# Patient Record
Sex: Female | Born: 1995 | Race: White | Marital: Married | Smoking: Never smoker
Health system: Southern US, Community
[De-identification: ages and names within clinical notes are randomized; demographics above are authoritative.]

---

## 2016-10-13 ENCOUNTER — Encounter: Payer: Self-pay | Admitting: Obstetrics & Gynecology

## 2016-10-23 ENCOUNTER — Ambulatory Visit (INDEPENDENT_AMBULATORY_CARE_PROVIDER_SITE_OTHER): Payer: BLUE CROSS/BLUE SHIELD | Admitting: Family Medicine

## 2016-10-23 ENCOUNTER — Encounter: Payer: Self-pay | Admitting: Family Medicine

## 2016-10-23 DIAGNOSIS — O09899 Supervision of other high risk pregnancies, unspecified trimester: Secondary | ICD-10-CM | POA: Insufficient documentation

## 2016-10-23 DIAGNOSIS — O0932 Supervision of pregnancy with insufficient antenatal care, second trimester: Secondary | ICD-10-CM

## 2016-10-23 DIAGNOSIS — Z3490 Encounter for supervision of normal pregnancy, unspecified, unspecified trimester: Secondary | ICD-10-CM

## 2016-10-23 DIAGNOSIS — Z124 Encounter for screening for malignant neoplasm of cervix: Secondary | ICD-10-CM

## 2016-10-23 DIAGNOSIS — Z113 Encounter for screening for infections with a predominantly sexual mode of transmission: Secondary | ICD-10-CM

## 2016-10-23 DIAGNOSIS — O09299 Supervision of pregnancy with other poor reproductive or obstetric history, unspecified trimester: Secondary | ICD-10-CM | POA: Insufficient documentation

## 2016-10-23 DIAGNOSIS — O09292 Supervision of pregnancy with other poor reproductive or obstetric history, second trimester: Secondary | ICD-10-CM | POA: Diagnosis not present

## 2016-10-23 DIAGNOSIS — O0930 Supervision of pregnancy with insufficient antenatal care, unspecified trimester: Secondary | ICD-10-CM | POA: Insufficient documentation

## 2016-10-23 MED ORDER — ASPIRIN EC 81 MG PO TBEC: 81.0000 mg | DELAYED_RELEASE_TABLET | Freq: Every day | ORAL | 2 refills | Status: AC

## 2016-10-23 NOTE — Progress Notes (Signed)
Pt here for initial prenatal visit. She has not had a pap and will need one today. Bedside US shows single IUP measuring 8232w4d avg between HC, FL, CRL. FHR = 145  CLINICAL DATA:  Pregnant patient in 2nd trimester pregnancy with (COMPLAINTS)  EXAM: AB OB ULTRASOUND   TECHNIQUE:  AB ultrasound was performed for complete evaluation of the gestation    FINDINGS: Intrauterine gestational sac: SIUP  Cardiac Activity: 145 bpm  US EDC: 5932w4d    By: Janit BernMandy Prestin Munch, CMA

## 2016-10-23 NOTE — Progress Notes (Signed)
  Subjective:    Tiffany Krueger is a G2P1001 745w6d being seen today for her first obstetrical visit.  Her obstetrical history is significant for short interval between pregnancies, history of preeclampsia in previous pregnancy. Patient does not intend to breast feed. Pregnancy history fully reviewed.  Patient reports no complaints.  Vitals:   10/23/16 0919 10/23/16 0919  BP: 121/71   Pulse: (!) 106   Weight: 181 lb (82.1 kg)   Height:  5' (1.524 m)    HISTORY: OB History  Gravida Para Term Preterm AB Living  2 1 1     1   SAB TAB Ectopic Multiple Live Births          1    # Outcome Date GA Lbr Len/2nd Weight Sex Delivery Anes PTL Lv  2 Current           1 Term 04/11/16 1173w0d    Vag-Spont   LIV     History reviewed. No pertinent past medical history. History reviewed. No pertinent surgical history. History reviewed. No pertinent family history.   Exam    Uterus:     Pelvic Exam:    Perineum: No Hemorrhoids, Normal Perineum   Vulva: normal, Bartholin's, Urethra, Skene's normal   Vagina:  normal mucosa   Cervix: multiparous appearance and no bleeding following Pap   Adnexa: normal adnexa   Bony Pelvis: gynecoid  System: Breast:  normal appearance, no masses or tenderness, No nipple discharge or bleeding, No axillary or supraclavicular adenopathy, Normal to palpation without dominant masses   Skin: normal coloration and turgor, no rashes    Neurologic: oriented, normal, gait normal; reflexes normal and symmetric   Extremities: normal strength, tone, and muscle mass   HEENT PERRLA and extra ocular movement intact   Mouth/Teeth mucous membranes moist, pharynx normal without lesions   Neck supple and no masses   Cardiovascular: regular rate and rhythm, no murmurs or gallops   Respiratory:  appears well, vitals normal, no respiratory distress, acyanotic, normal RR, ear and throat exam is normal, neck free of mass or lymphadenopathy, chest clear, no wheezing, crepitations,  rhonchi, normal symmetric air entry   Abdomen: soft, non-tender; bowel sounds normal; no masses,  no organomegaly   Urinary: urethral meatus normal      Assessment:    Pregnancy: G2P1001 Patient Active Problem List   Diagnosis Date Noted  . Supervision of normal pregnancy, antepartum 10/23/2016  . Late prenatal care 10/23/2016  . Short interval between pregnancies affecting pregnancy, antepartum 10/23/2016        Plan:     Initial labs drawn. Prenatal vitamins. Baby ASA  Problem list reviewed and updated. Genetic Screening discussed Quad Screen: declined.  Ultrasound discussed; fetal survey: ordered.  Follow up in 4 weeks. 50% of 30 min visit spent on counseling and coordination of care.       Levie HeritageJacob J Bennie Scaff 10/23/2016

## 2016-10-24 LAB — GC/CHLAMYDIA PROBE AMP (~~LOC~~) NOT AT ARMC
Chlamydia: NEGATIVE
Neisseria Gonorrhea: NEGATIVE

## 2016-10-24 LAB — OBSTETRIC PANEL, INCLUDING HIV
Antibody Screen: NEGATIVE
BASOS ABS: 0 10*3/uL (ref 0.0–0.2)
Basos: 0 %
EOS (ABSOLUTE): 0.1 10*3/uL (ref 0.0–0.4)
Eos: 1 %
HEP B S AG: NEGATIVE
HIV SCREEN 4TH GENERATION: NONREACTIVE
Hematocrit: 39.9 % (ref 34.0–46.6)
Hemoglobin: 13.6 g/dL (ref 11.1–15.9)
IMMATURE GRANULOCYTES: 0 %
Immature Grans (Abs): 0 10*3/uL (ref 0.0–0.1)
LYMPHS ABS: 2 10*3/uL (ref 0.7–3.1)
Lymphs: 18 %
MCH: 30.4 pg (ref 26.6–33.0)
MCHC: 34.1 g/dL (ref 31.5–35.7)
MCV: 89 fL (ref 79–97)
MONOCYTES: 4 %
Monocytes Absolute: 0.5 10*3/uL (ref 0.1–0.9)
NEUTROS ABS: 8.8 10*3/uL — AB (ref 1.4–7.0)
NEUTROS PCT: 77 %
PLATELETS: 252 10*3/uL (ref 150–379)
RBC: 4.47 x10E6/uL (ref 3.77–5.28)
RDW: 13.3 % (ref 12.3–15.4)
RPR: NONREACTIVE
Rh Factor: POSITIVE
Rubella Antibodies, IGG: 6.57 index (ref >0.99)
WBC: 11.5 10*3/uL — AB (ref 3.4–10.8)

## 2016-10-24 LAB — HEMOGLOBIN A1C
ESTIMATED AVERAGE GLUCOSE: 85 mg/dL
HEMOGLOBIN A1C: 4.6 % — AB (ref 4.8–5.6)

## 2016-10-24 LAB — CYTOLOGY - PAP: Diagnosis: NEGATIVE

## 2016-10-24 LAB — GLUCOSE, RANDOM: GLUCOSE: 88 mg/dL (ref 65–99)

## 2016-10-27 ENCOUNTER — Ambulatory Visit (HOSPITAL_COMMUNITY)
Admission: RE | Admit: 2016-10-27 | Discharge: 2016-10-27 | Disposition: A | Discharge status: Home / Self Care | Payer: BLUE CROSS/BLUE SHIELD | Source: Ambulatory Visit | Attending: Family Medicine | Admitting: Family Medicine

## 2016-10-27 DIAGNOSIS — O283 Abnormal ultrasonic finding on antenatal screening of mother: Secondary | ICD-10-CM

## 2016-10-27 DIAGNOSIS — O09299 Supervision of pregnancy with other poor reproductive or obstetric history, unspecified trimester: Secondary | ICD-10-CM | POA: Diagnosis not present

## 2016-10-27 DIAGNOSIS — Z3490 Encounter for supervision of normal pregnancy, unspecified, unspecified trimester: Secondary | ICD-10-CM

## 2016-10-27 DIAGNOSIS — O09892 Supervision of other high risk pregnancies, second trimester: Secondary | ICD-10-CM | POA: Insufficient documentation

## 2016-10-27 DIAGNOSIS — O09899 Supervision of other high risk pregnancies, unspecified trimester: Secondary | ICD-10-CM

## 2016-10-27 DIAGNOSIS — O0930 Supervision of pregnancy with insufficient antenatal care, unspecified trimester: Secondary | ICD-10-CM

## 2016-10-27 DIAGNOSIS — Z3689 Encounter for other specified antenatal screening: Secondary | ICD-10-CM | POA: Insufficient documentation

## 2016-10-27 DIAGNOSIS — Z3A18 18 weeks gestation of pregnancy: Secondary | ICD-10-CM | POA: Diagnosis not present

## 2016-10-27 LAB — CULTURE, URINE COMPREHENSIVE

## 2016-10-28 LAB — TOXASSURE SELECT 13 (MW), URINE

## 2016-10-30 ENCOUNTER — Telehealth (HOSPITAL_COMMUNITY): Payer: Self-pay | Admitting: MS"

## 2016-10-30 DIAGNOSIS — O283 Abnormal ultrasonic finding on antenatal screening of mother: Secondary | ICD-10-CM | POA: Insufficient documentation

## 2016-10-30 DIAGNOSIS — Z3A18 18 weeks gestation of pregnancy: Secondary | ICD-10-CM | POA: Insufficient documentation

## 2016-10-30 NOTE — Progress Notes (Signed)
Genetic Counseling  High-Risk Gestation Note  Appointment Date:  10/30/2016 Referred By: Truett Mainland, DO Date of Birth:  09/21/1996 Partner:  Tiffany Krueger   Pregnancy History: [id number] Estimated Date of Delivery: 03/30/17 Estimated Gestational Age: [redacted]w[redacted]d I met with Mrs. Tiffany Krueger her husband, [name] for genetic counseling because of abnormal ultrasound findings.   In summary:  Discussed ultrasound findings in detail  Atypical fetal cardiac anatomy today: abnormal situs, cardiac angle nearly 90 degrees, abnormal outflow tracts  Single umbilical artery  Discussed can be isolated or syndromic and that various etiologies including sporadic, multifactorial, chromosome condition, single gene conditions, teratogenic exposure  Reviewed options for additional screening  NIPS- elected to pursue Panorama today (expanded panel to include 22q11 deletion analysis)  Echocardiogram- being facilitated ASAP  Expanded carrier screening panel- declined  Ongoing ultrasound- follow-up scheduled 11/10/16  Reviewed options for diagnostic testing, including risks, benefits, limitations and alternatives- declined amniocentesis  Discussed option of continuation vs. Termination of pregnancy  Reviewed family history concerns  Patient's paternal half-niece (paternal half-brother's daughter) with unilateral renal agenesis  We began by reviewing the ultrasound in detail. Anatomy ultrasound performed today visualized the pregnancy to be 179w0dwith an EDC of 03/30/17. Atypical fetal heart anatomy was visualized today.  Abnormal situs, cardiac angle nearly 90 degrees, and abnormal outflow tracts with a suspected interrupted arch and abnormal RVOT. Single umbilical artery visualized today. Remaining visualized fetal anatomy within normal limits. Complete ultrasound results under separate cover.    This couple was counseled that congenital heart defects occur at a frequency of  approximately 1 in 100 births.  The specific heart defect present in the pregnancy was not able to be determined today, but we reviewed that there are concerns with the outflow tracts and position of the heart. We discussed that congenital heart defects can be genetic, environmental, or multifactorial in etiology.  This couple was counseled that congenital heart defects are prominent features in chromosome conditions, particularly autosomal trisomies and microdeletions.  Approximately 15-20% of congenital heart defects result from an underlying chromosome condition.    We reviewed chromosomes, nondisjunction, and the common features of Down syndrome, trisomies 13/18, and 22q11 deletion syndrome. We reviewed that other chromosome aberrations, including microdeletions, microduplications, and inversions, can also be associated with fetal heart defects.  In addition, single gene conditions are also a common genetic cause of congenital heart defects.  To date, more than 500 single gene disorders are known to have congenital heart disease as a described feature. We briefly reviewed specific single gene inheritance patterns and associated risks of recurrence.    Environmental causes for congenital heart defects are well described and many teratogens are known to be linked to congenital heart disease (alcohol and specific medications).  Additionally, we discussed that the incidence of congenital heart defects is increased among individuals who have insulin dependent diabetes. Mrs. AsAndrika Perazaeported no known teratogenic exposures during the pregnancy. The remainder of the cases (>70%) are classified as multifactorial, referring to the condition being caused by a combination of both environmental and genetic causes.  We reviewed multifactorial inheritance and the associated risks of recurrence.  We discussed available screening and testing options. Regarding screening for chromosome conditions, we discussed the  option of noninvasive prenatal screening (NIPS)/prenatal cell free DNA testing. We reviewed the methodology of this screen, the conditions for which it assesses, and the detection rates and false positive rates. They understand that while highly specific and highly sensitive, it is  not considered diagnostic for chromosome conditions and does not assess for all chromosome conditions.  In addition, we reviewed the option of amniocentesis including the associated risks, benefits, and limitations.  She was counseled that karyotype analysis and/or chromosome microarray analysis can be performed both prenatally (amniocentesis) and postnatally (cord blood or peripheral blood).  The couple understands that amniocentesis cannot assess for all genetic conditions. Specifically, we discussed that single gene conditions are difficult to diagnose prenatally unless a specific condition is suspected base on additional ultrasound findings or family history.    Regarding single gene conditions, we discussed the option of expanded carrier screening, to assess for carrier status of a panel of 175 autosomal recessive and X-linked conditions. We reviewed that ACOG currently recommends that all patients be offered carrier screening for cystic fibrosis, spinal muscular atrophy and hemoglobinopathies. In addition, they were counseled that there are a variety of genetic screening laboratories that have pan-ethnic, or expanded, carrier screening panels, which evaluate carrier status for a wide range of genetic conditions. Some of these conditions are severe and actionable, but also rare; others occur more commonly, but are less severe. We reviewed that the prevalence of each condition varies (and often varies with ethnicity). We discussed that some of the conditions can have congenital heart disease as a symptom but that the panel also includes conditions which are not associated with congenital heart disease. The couples' background risk  to be a carrier for each of these various conditions would range, and in some cases be very low or unknown. Similarly, the detection rate varies with each condition and also varies in some cases with ethnicity, ranging from greater than 99% (in the case of hemoglobinopathies) to unknown. A negative carrier screen would thus reduce, but not eliminate the chance to be a carrier for these conditions. For some conditions included on specific pan-ethnic carrier screening panels, the pre-test carrier frequency and/or the detection rate is unknown. We reviewed that in the event that one partner is found to be a carrier for one or more conditions, carrier screening would be available to the partner for those conditions. We also reviewed that this panel does not assess for all single gene conditions and does not assess specifically for conditions in the fetus. We discussed the risks, limitations, and benefits of each screening and testing option. We discussed the possible results that the tests might provide including: positive, negative, unanticipated, and no result. Finally, they were counseled regarding the cost of each option and potential out of pocket expenses.  After thoughtful consideration of these options, Mrs. Tiffany Krueger elected to pursue NIPS (Panorama through [facility] laboratory, expanded panel to include screening for select microdeletion syndromes in addition to common fetal aneuploidy conditions). They declined amniocentesis at this time. She declined expanded carrier screening at this time.   We discussed that the postnatal prognosis is highly dependent upon the underlying etiology. We reviewed options for the pregnancy including termination of pregnancy, follow-up ultrasounds and fetal echocardiogram and postnatal genetics evaluation. The couple understands that the legal limit for TOP in [place] is [redacted] weeks gestation.  Follow up ultrasound and fetal echocardiogram were recommended and scheduled. Mrs.  Tiffany Krueger has a follow-up ultrasound scheduled for 11/10/16. Our office is attempting to facilitate fetal echocardiogram appointment within the next week.     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 This was reportedly identified prenatally. She is currently 22 years old  and is healthy and asymptomatic. No additional relatives were reported with kidney differences. Renal agenesis is typically sporadic. However, there are familial cases reported that are consistent with autosomal dominant inheritance. Additionally, prenatal exposures such as warfarin, cocaine, and maternal diabetes have been reported to be associated with renal agenesis. Renal agenesis has been described as a feature of two birth defects associations: VACTERL and MURCS, but no additional features suggestive of these associations were reported for this relative. We discussed that in the case of isolated renal agenesis, recurrence risk is likely low for other relatives, particularly given the degree of relation for this relative. However, given that autosomal dominant inheritance has been observed in families, renal imaging would be available to this relative's parents in order to more accurately determine recurrence risk assessment for relatives. Without further information regarding the provided family history, an accurate genetic risk cannot be calculated. Further genetic counseling is warranted if more information is obtained. Ms. Tiffany Krueger reported [place] and [place] ancestry, and Tiffany Krueger reported [place] and [place] ancestry. The couple denied consanguinity.   Ms. Tiffany Krueger denied exposure to environmental toxins or chemical agents. She denied the use of alcohol, tobacco or street drugs. She denied significant viral illnesses during the course of her pregnancy. Her medical and surgical histories were noncontributory.    I counseled this couple regarding the above risks and available options.  The approximate face-to-face time with the genetic counselor was 35 minutes.  Chipper Oman, MS Certified Genetic Counselor 10/30/2016

## 2016-10-30 NOTE — Telephone Encounter (Signed)
Patient called to inquire about options for continuing versus termination of pregnancy. We reviewed that the limit for termination of pregnancy in [place] is [redacted] weeks gestation. Based on the patient's ultrasound on Friday, her estimated date of delivery is 03/30/17, meaning she is currently 7948w3d gestation. We briefly reviewed termination of pregnancy via induction of labor versus dilation and evacuation. We discussed that induction of labor may possibly be able to be [facility] but would need to be discussed further with her primary OB. Regarding D&E, discussed that this may or may not be available through her provider but could be facilitated through [facility]. Offered the schedule the patient a pregnancy options consult with one of the [facility] providers to further review and discuss these options. Discussed that this would likely be scheduled in [place] office. The patient would like to pursue this consultation appointment. Fetal echocardiogram appointment is currently also being facilitated for the patient. I planned to contact patient once the pregnancy options consult was scheduled.   [name] Astryd Pearcy 10/30/2016 9:42 AM

## 2016-10-30 NOTE — Telephone Encounter (Signed)
Called patient to inform her of pregnancy options consultation with Dr. Bobbe MedicoApril Miller in [facility] on Wednesday, January 31 at 10:00 am. This is at the [address] location. The patient requested for the address to be emailed to her, given that she was not at a place where she would write information down. Her contact email is [email or web address]. ]  [name] Zamiah Tollett  10/30/2016 10:16 AM   Sent the following e-mail to the patient, per her request.   Renee RivalHi Jacynda, Your appointment is scheduled with Dr. Bobbe MedicoApril Miller this Wednesday, Jan 31 at 10:00 am. The OB/GYN office is located inside the medical office building at [address] [place], [place]. Parking lot is directly in front of the building. The parking lot technically brings you in on the second floor of the building, which is the floor you want to be on. When you enter the building, turn down the hallway to your left (past the elevators), and it will lead you to the Obstetrics and Gynecology office. Check in with your maiden name, and then the person at the front desk should be able to update your name in the system if you have your picture ID with you. I've included a link below that describes some of the driving directions to the clinic office location. The phone number for the OB/GYN office is [phone].  Our [place] desk in [place] is still working with pediatric cardiology to try to schedule a fetal echocardiogram this week, and somebody should be in touch with you about that appointment as well.  [email or web address] Quinn PlowmanKaren Trelon Plush, MS  Certified Genetic Counselor  [facility]  Maternal Fetal Medicine  [facility]  p: [phone]  f: [phone]

## 2016-10-31 ENCOUNTER — Other Ambulatory Visit (HOSPITAL_COMMUNITY): Payer: Self-pay | Admitting: *Deleted

## 2016-10-31 DIAGNOSIS — O358XX0 Maternal care for other (suspected) fetal abnormality and damage, not applicable or unspecified: Secondary | ICD-10-CM

## 2016-10-31 DIAGNOSIS — O35BXX0 Maternal care for other (suspected) fetal abnormality and damage, fetal cardiac anomalies, not applicable or unspecified: Secondary | ICD-10-CM

## 2016-11-03 ENCOUNTER — Telehealth (HOSPITAL_COMMUNITY): Payer: Self-pay | Admitting: MS"

## 2016-11-03 NOTE — Telephone Encounter (Signed)
Left message for patient to return call. Stated that I was calling to follow-up after her appointments on Wednesday and that I understood that she was seen to discuss pregnancy options at [facility]. (Patient's name in [facility] system is "Lanice Schwabshley Blaylock.")  However, it appeared that she did not go for her scheduled fetal echocardiogram on 1/31 with [facility]. I was unsure if this was the patient's decision or if she was potentially unaware of the fetal echocardiogram appointment on that date. Stressed importance of being seen for fetal echocardiogram to further clarify the specific fetal heart anatomy and asked patient to call back so that this could be coordinated for her. Left number for patient to return call to discuss further.   [name] Davelle Anselmi 11/03/2016 10:49 AM

## 2016-11-05 ENCOUNTER — Other Ambulatory Visit: Payer: Self-pay

## 2016-11-08 ENCOUNTER — Telehealth (HOSPITAL_COMMUNITY): Payer: Self-pay | Admitting: Genetics

## 2016-11-08 NOTE — Telephone Encounter (Signed)
Returned Ms. Tiffany Krueger' call to [name] Corneliussen who is out of the office.  There was no answer.  I left a voicemail that I was returning a call that ms. Tiffany Krueger had made to [name].

## 2016-11-10 ENCOUNTER — Ambulatory Visit (HOSPITAL_COMMUNITY)
Admission: RE | Admit: 2016-11-10 | Discharge: 2016-11-10 | Disposition: A | Discharge status: Home / Self Care | Payer: BLUE CROSS/BLUE SHIELD | Source: Ambulatory Visit | Attending: Family Medicine | Admitting: Family Medicine

## 2016-11-10 ENCOUNTER — Encounter (HOSPITAL_COMMUNITY): Payer: Self-pay

## 2016-11-16 ENCOUNTER — Ambulatory Visit (HOSPITAL_COMMUNITY): Payer: BLUE CROSS/BLUE SHIELD

## 2016-11-23 ENCOUNTER — Telehealth (HOSPITAL_COMMUNITY): Payer: Self-pay | Admitting: MS"

## 2016-11-23 ENCOUNTER — Encounter (HOSPITAL_COMMUNITY): Payer: Self-pay | Admitting: MS"

## 2016-11-23 NOTE — Telephone Encounter (Signed)
Patient elected to pursue termination of pregnancy, [facility].

## 2016-11-23 NOTE — Addendum Note (Signed)
Encounter addended by: Dessie ComaKaren Louise Ech Brandin Stetzer on: 11/23/2016  1:46 PM<BR>    Actions taken: Episode resolved, Problem List modified

## 2016-11-24 ENCOUNTER — Encounter: Payer: BLUE CROSS/BLUE SHIELD | Admitting: Obstetrics & Gynecology

## 2018-05-15 IMAGING — US US MFM OB COMP +14 WKS
1 series · 14 of 28 positions shown · non-contrast
Comparison: none

[Series 1: us mfm ob comp +14 wks · 14 of 66 slices shown]
[im 3/66]
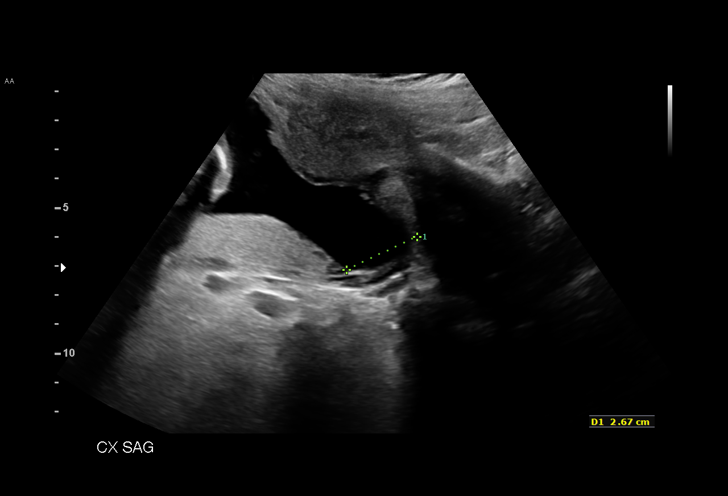
[im 8/66]
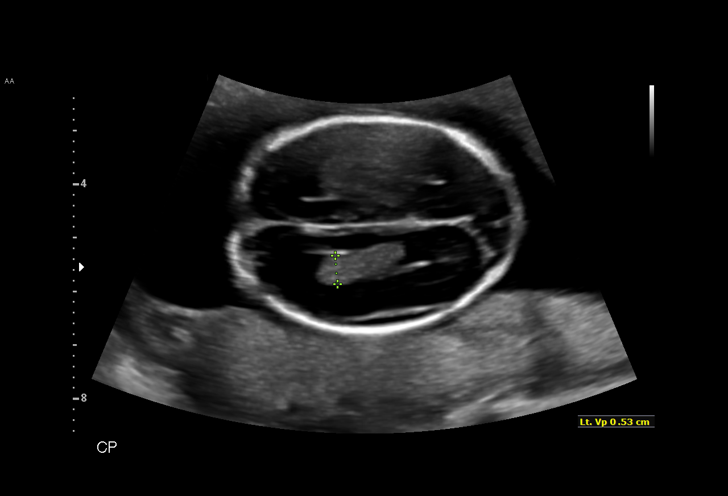
[im 13/66]
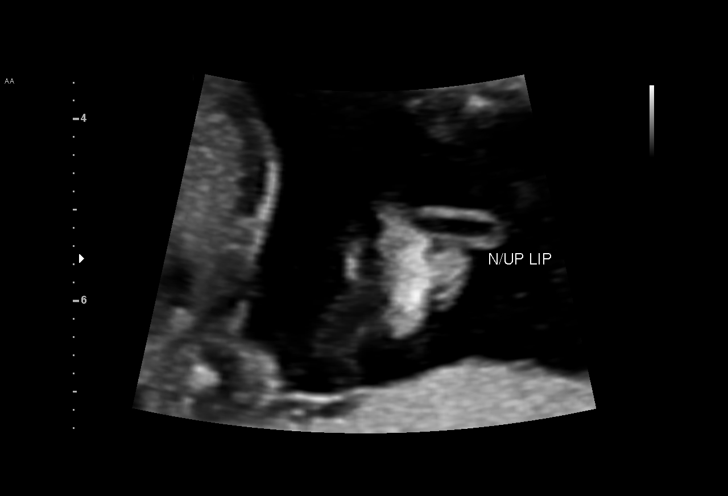
[im 17/66]
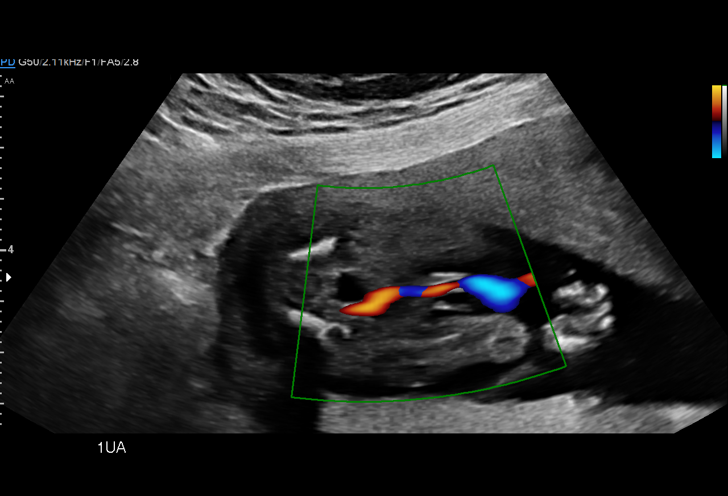
[im 22/66]
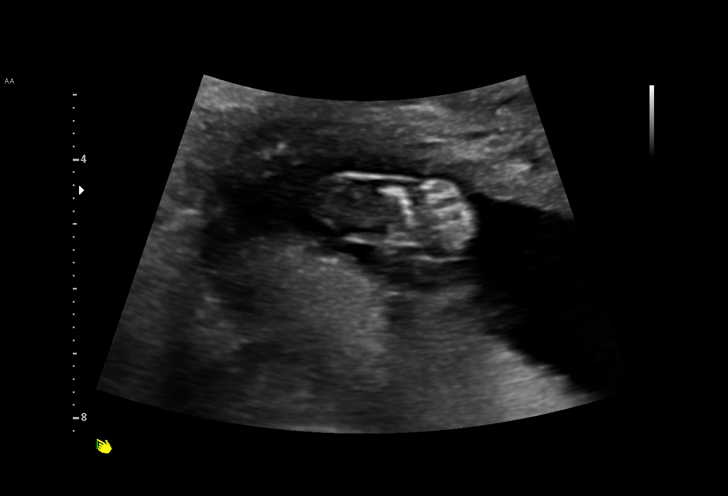
[im 27/66]
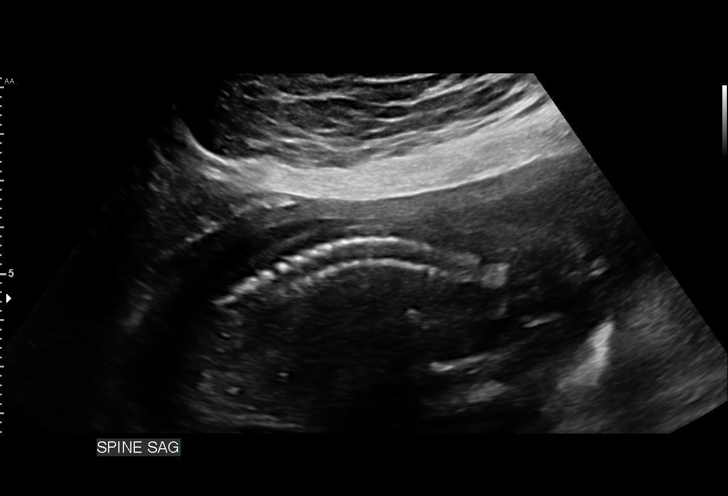
[im 32/66]
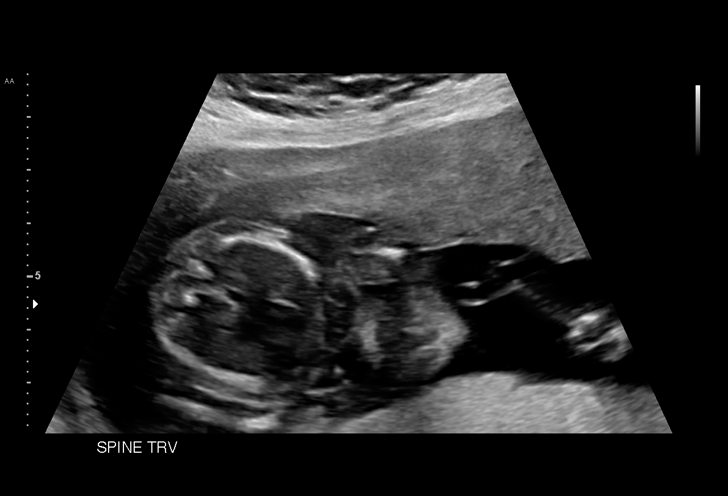
[im 37/66]
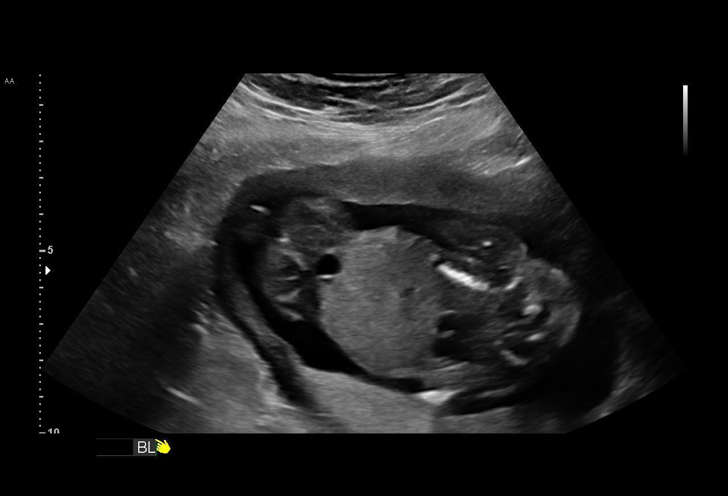
[im 41/66]
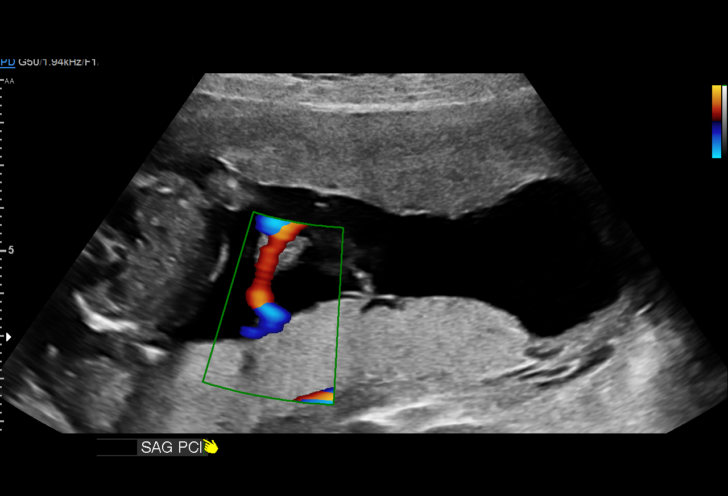
[im 46/66]
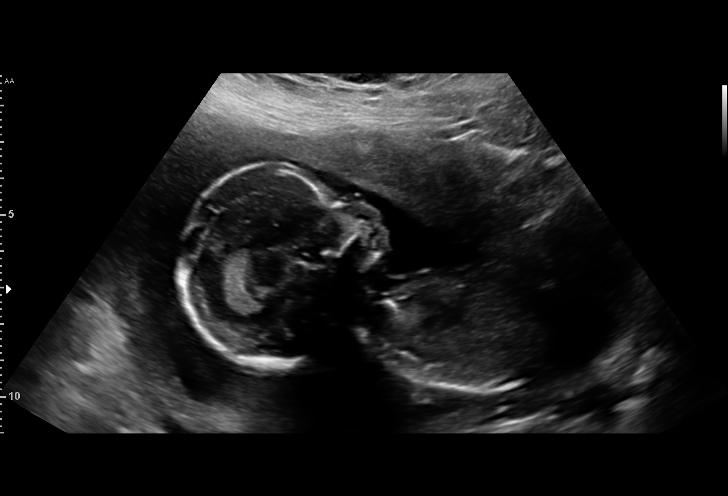
[im 51/66]
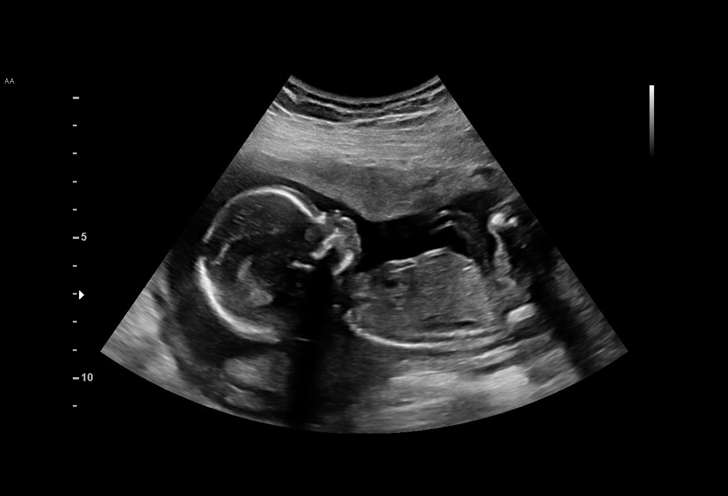
[im 56/66]
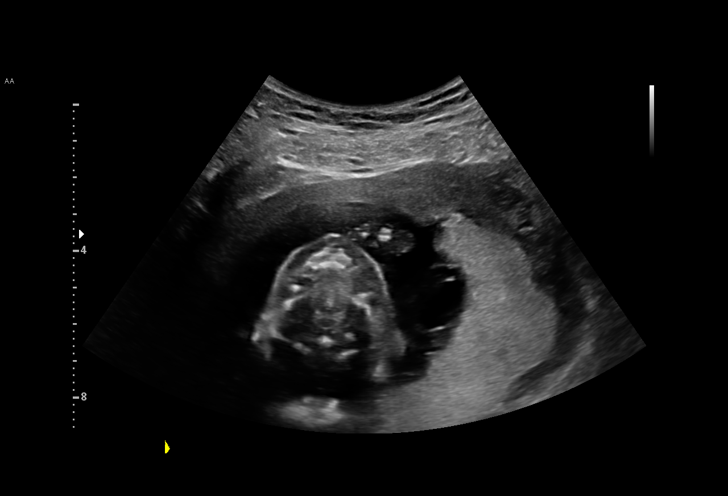
[im 61/66]
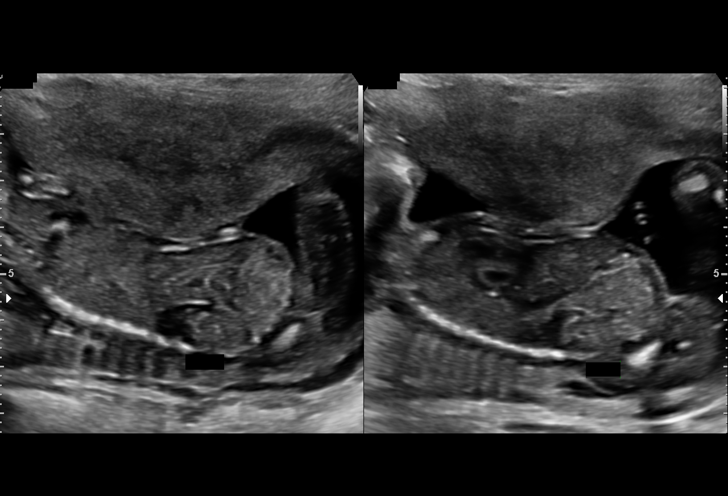
[im 66/66]
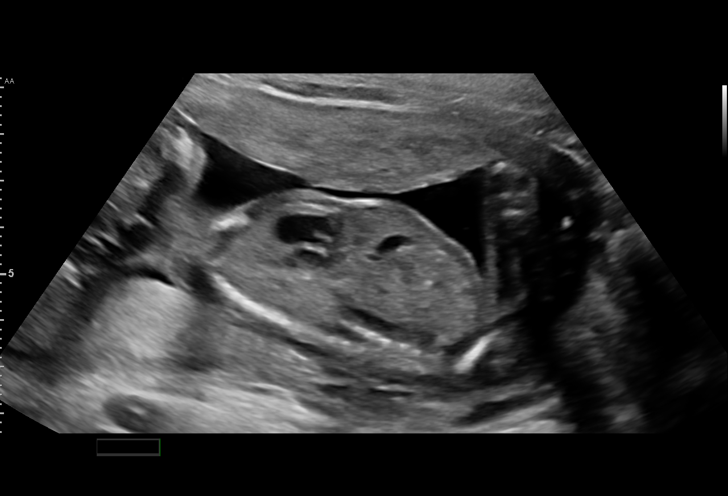

[14 of 28 positions shown; findings below may reference images not displayed]

Indications

18 weeks gestation of pregnancy
Encounter for fetal anatomic survey
Short interval between pregancies, 2nd
trimester
Poor obstetric history: Previous preeclampsia
OB History

Gravidity:    2         Term:   1
Living:       1
Fetal Evaluation

Num Of Fetuses:     1
Fetal Heart         141
Rate(bpm):
Cardiac Activity:   Observed
Presentation:       Breech
Placenta:           Posterior, above cervical os
P. Cord Insertion:  Visualized

Amniotic Fluid
AFI FV:      Subjectively within normal limits
Biometry

BPD:      39.4  mm     G. Age:  18w 0d         49  %    CI:        65.36   %   70 - 86
FL/HC:      15.7   %   15.8 - 18
HC:      156.5  mm     G. Age:  18w 5d         70  %    HC/AC:      1.26       1.07 -
AC:      124.1  mm     G. Age:  18w 0d         49  %    FL/BPD:     62.2   %
FL:       24.5  mm     G. Age:  17w 3d         23  %    FL/AC:      19.7   %   20 - 24
HUM:      21.5  mm     G. Age:  16w 4d         11  %
CER:      18.4  mm     G. Age:  18w 1d         56  %
NFT:       2.1  mm
CM:        4.2  mm
ULN:        20  mm     G. Age:  16w 6d         16  %
TIB:        20  mm     G. Age:  17w 0d         28  %
RAD:      19.1  mm     G. Age:  16w 4d         34  %
FIB:        23  mm     G. Age:  18w 0d         58  %

Est. FW:     211  gm      0 lb 7 oz     45  %
Gestational Age

LMP:           19w 3d       Date:   06/13/16                 EDD:   03/20/17
U/S Today:     18w 0d                                        EDD:   03/30/17
Best:          18w 0d    Det. By:   U/S (10/27/16)           EDD:   03/30/17
Anatomy

Cranium:               Appears normal         Diaphragm:              Appears normal
Cavum:                 Appears normal         Abdomen:                Appears normal
Ventricles:            Appears normal         Abdominal Wall:         Appears nml (cord
insert, abd wall)
Choroid Plexus:        Appears normal         Cord Vessels:           2 Vessel Cord
Cerebellum:            Appears normal         Kidneys:                Appear normal
Posterior Fossa:       Appears normal         Bladder:                Appears normal
Nuchal Fold:           Appears normal         Spine:                  Appears normal
Face:                  Orbits appear          Upper Extremities:      Appears normal
normal
Lips:                  Appears normal         Lower Extremities:      Appears normal
Palate:                Appears normal

Other:  Fetus appears to be a female. 5th digit visualized.Technically difficult
due to maternal habitus and fetal position.
Cervix Uterus Adnexa

Cervix
Length:            4.8  cm.
Normal appearance by transabdominal scan.
Impression

Singleton intrauterine pregnancy at 18+0 weeks by US today.
Review of the anatomy shows 2 vessel umbilical cord,
abnormal situs, cardiac angle nearly 90 degrees, and
abnormal outflow trancs with a suspected interrupted arch
and abnormal RVOT
Amniotic fluid volume is normal
Estimated fetal weight is 211g which is growth in the 45th
percentile
Recommendations

Discussed findings with patient and her partner, Genetic
counseling to see patient today to discuss chromosomal
testing. Will get fetal echocardiogram ASAP. Follow up exam
here in 2 weeks
